# Patient Record
Sex: Female | Born: 1980 | Race: White | Hispanic: No | Marital: Married | Smoking: Current every day smoker
Health system: Southern US, Community
[De-identification: ages and names within clinical notes are randomized; demographics above are authoritative.]

## PROBLEM LIST (undated history)

## (undated) DIAGNOSIS — Z950 Presence of cardiac pacemaker: Secondary | ICD-10-CM

## (undated) DIAGNOSIS — Q246 Congenital heart block: Secondary | ICD-10-CM

## (undated) DIAGNOSIS — N809 Endometriosis, unspecified: Secondary | ICD-10-CM

## (undated) DIAGNOSIS — F411 Generalized anxiety disorder: Secondary | ICD-10-CM

## (undated) DIAGNOSIS — F339 Major depressive disorder, recurrent, unspecified: Secondary | ICD-10-CM

## (undated) DIAGNOSIS — I10 Essential (primary) hypertension: Secondary | ICD-10-CM

## (undated) HISTORY — PX: PACEMAKER INSERTION: SHX728

## (undated) HISTORY — DX: Endometriosis, unspecified: N80.9

## (undated) HISTORY — PX: ABDOMINAL HYSTERECTOMY: SHX81

## (undated) HISTORY — DX: Essential (primary) hypertension: I10

## (undated) HISTORY — DX: Generalized anxiety disorder: F41.1

## (undated) HISTORY — DX: Major depressive disorder, recurrent, unspecified: F33.9

---

## 2012-08-24 ENCOUNTER — Emergency Department (HOSPITAL_COMMUNITY)
Admission: EM | Admit: 2012-08-24 | Discharge: 2012-08-24 | Discharge status: Against Medical Advice | Payer: Medicaid Other | Attending: Internal Medicine | Admitting: Internal Medicine

## 2012-08-24 ENCOUNTER — Encounter (HOSPITAL_COMMUNITY): Payer: Self-pay | Admitting: *Deleted

## 2012-08-24 DIAGNOSIS — R51 Headache: Secondary | ICD-10-CM | POA: Insufficient documentation

## 2012-08-24 HISTORY — DX: Congenital heart block: Q24.6

## 2012-08-24 HISTORY — DX: Presence of cardiac pacemaker: Z95.0

## 2012-08-24 NOTE — ED Notes (Signed)
Pt to nurses station states no longer able to wait due to child care; instructed may return at any time; voiced understanding; discharged with family member

## 2012-08-24 NOTE — ED Notes (Signed)
Pt states was playing football two wks ago; c/o neck stiffness on left bottom of head causing her a headache; relieved by heat; states ice makes it worse

## 2014-07-09 ENCOUNTER — Emergency Department (INDEPENDENT_AMBULATORY_CARE_PROVIDER_SITE_OTHER)
Admission: EM | Admit: 2014-07-09 | Discharge: 2014-07-09 | Disposition: A | Discharge status: Home / Self Care | Payer: BLUE CROSS/BLUE SHIELD | Source: Home / Self Care | Attending: Family Medicine | Admitting: Family Medicine

## 2014-07-09 ENCOUNTER — Encounter (HOSPITAL_COMMUNITY): Payer: Self-pay | Admitting: *Deleted

## 2014-07-09 DIAGNOSIS — L0100 Impetigo, unspecified: Secondary | ICD-10-CM

## 2014-07-09 DIAGNOSIS — T148 Other injury of unspecified body region: Secondary | ICD-10-CM

## 2014-07-09 DIAGNOSIS — W5503XA Scratched by cat, initial encounter: Secondary | ICD-10-CM

## 2014-07-09 MED ORDER — AZITHROMYCIN 250 MG PO TABS: 250.0000 mg | ORAL_TABLET | Freq: Every day | ORAL | Status: DC

## 2014-07-09 MED ORDER — MUPIROCIN 2 % EX OINT: 1.0000 "application " | TOPICAL_OINTMENT | Freq: Two times a day (BID) | CUTANEOUS | Status: DC

## 2014-07-09 MED ORDER — FLUCONAZOLE 150 MG PO TABS: 150.0000 mg | ORAL_TABLET | Freq: Every day | ORAL | Status: DC

## 2014-07-09 NOTE — Discharge Instructions (Signed)
You have a infection of your scalp. This will require antibiotics in order to clear. Please take the antibiotics as directed. Please use the ointment twice a day for 3-5 days if the infection is not initially cleared with the antibiotic Please use the Diflucan if you develop a yeast infection.

## 2014-07-09 NOTE — ED Notes (Signed)
C/O "red, swollen, itchy bump" to left posterior scalp x 2 days.  Denies fevers.  States mildly tender.

## 2014-07-09 NOTE — ED Provider Notes (Signed)
CSN: [id number]     Arrival date & time 07/09/14  1609 History   First MD Initiated Contact with Patient 07/09/14 1645     Chief Complaint  Patient presents with  . Abscess   (Consider location/radiation/quality/duration/timing/severity/associated sxs/prior Treatment) HPI  Abscesss: started 2 days ago. On back of scalp. Getting bigger and ttp. No discharge. Deneis fevers, rash, nausea. Now w/ swollen bumps in neck. Has not tried anything to make it better. Sore is constant.    Past Medical History  Diagnosis Date  . Pacemaker   . Congenital heart block    Past Surgical History  Procedure Laterality Date  . Pacemaker insertion    . Abdominal hysterectomy     Family History  Problem Relation Age of Onset  . Hypertension Mother   . Hyperlipidemia Mother   . Hyperlipidemia Father   . Hypertension Father    History  Substance Use Topics  . Smoking status: Current Every Day Smoker -- 0.50 packs/day    Types: Cigarettes  . Smokeless tobacco: Not on file  . Alcohol Use: Yes     Comment: rarely   OB History    No data available     Review of Systems Per HPI with all other pertinent systems negative.    Allergies  Review of patient's allergies indicates no known allergies.  Home Medications   Prior to Admission medications   Medication Sig Start Date End Date Taking? Authorizing Provider  acetaminophen (TYLENOL) 500 MG tablet Take 1,000 mg by mouth every 6 (six) hours as needed for pain.    Historical Provider, MD  azithromycin (ZITHROMAX) 250 MG tablet Take 1 tablet (250 mg total) by mouth daily. Take first 2 tablets together, then 1 every day until finished. 07/09/14   Ozella Rocks, MD  fluconazole (DIFLUCAN) 150 MG tablet Take 1 tablet (150 mg total) by mouth daily. Repeat dose in 3 days 07/09/14   Ozella Rocks, MD  mupirocin ointment (BACTROBAN) 2 % Apply 1 application topically 2 (two) times daily. 07/09/14   Ozella Rocks, MD  PARoxetine (PAXIL) 20 MG tablet Take  20 mg by mouth daily.    Historical Provider, MD   BP 143/81 mmHg  Pulse 83  Temp(Src) 98 F (36.7 C) (Oral)  Resp 16  SpO2 100% Physical Exam  Constitutional: She is oriented to person, place, and time. She appears well-developed and well-nourished.  HENT:  Head: Normocephalic.  Eyes: EOM are normal. Pupils are equal, round, and reactive to light.  Neck: Normal range of motion.  Cardiovascular: Normal rate and normal heart sounds.   No murmur heard. Pulmonary/Chest: Effort normal and breath sounds normal.  Abdominal: Soft. She exhibits no distension.  Musculoskeletal: Normal range of motion. She exhibits no tenderness.  Lymphadenopathy:    She has cervical adenopathy.  Neurological: She is alert and oriented to person, place, and time.  Skin:  Left posterior scalp with single red scratch and multiple surrounding erythematous follicles with small pustules in the center. Few areas with additional honey crusting. Total area of induration approximately 2.5 x 2 cm grade no area of fluctuance. Tender to palpation.  Psychiatric: She has a normal mood and affect. Her behavior is normal. Judgment and thought content normal.    ED Course  Procedures (including critical care time) Labs Review Labs Reviewed - No data to display  Imaging Review No results found.   MDM   1. Cat scratch   2. Impetigo    Z-Pak Mupirocin  ointment NSAIDs for pain. Diflucan when necessary yeast infection. Precautions given and all questions answered   Shelly Flattenavid Cartier Mapel, MD Family Medicine 07/09/2014, 5:20 PM  .   Ozella Rocksavid J Zeanna Sunde, MD 07/09/14 [phone]

## 2018-07-15 ENCOUNTER — Encounter: Payer: Self-pay | Admitting: Family Medicine

## 2018-07-16 ENCOUNTER — Ambulatory Visit: Payer: Self-pay | Admitting: Family Medicine

## 2018-08-11 ENCOUNTER — Encounter: Payer: Self-pay | Admitting: Family Medicine

## 2018-08-13 ENCOUNTER — Other Ambulatory Visit: Payer: Self-pay

## 2018-08-13 ENCOUNTER — Ambulatory Visit (INDEPENDENT_AMBULATORY_CARE_PROVIDER_SITE_OTHER): Payer: 59 | Admitting: Family Medicine

## 2018-08-13 VITALS — BP 121/78 | HR 79 | Resp 17 | Ht 66.0 in | Wt 219.8 lb

## 2018-08-13 DIAGNOSIS — Z6835 Body mass index (BMI) 35.0-35.9, adult: Secondary | ICD-10-CM

## 2018-08-13 DIAGNOSIS — E669 Obesity, unspecified: Secondary | ICD-10-CM

## 2018-08-13 DIAGNOSIS — K649 Unspecified hemorrhoids: Secondary | ICD-10-CM

## 2018-08-13 DIAGNOSIS — K5901 Slow transit constipation: Secondary | ICD-10-CM

## 2018-08-13 DIAGNOSIS — Z1329 Encounter for screening for other suspected endocrine disorder: Secondary | ICD-10-CM

## 2018-08-13 MED ORDER — POLYETHYLENE GLYCOL 3350 17 G PO PACK: 17.0000 g | PACK | Freq: Every day | ORAL | 0 refills | Status: AC

## 2018-08-13 MED ORDER — HYDROCORTISONE ACETATE 25 MG RE SUPP: 25.0000 mg | Freq: Two times a day (BID) | RECTAL | 0 refills | Status: AC

## 2018-08-13 NOTE — Patient Instructions (Signed)
Thank you for choosing Primary Care at [facility] for your medical home!    Leslie Coffey was seen by Joaquin Courts, FNP today.   [name]'s primary care doctor is Bing Neighbors, FNP.   For the best care possible,  you should try to see Joaquin Courts, FNP-C  whenever you come to clinic.   We look forward to seeing you again soon!  If you have any questions about your visit today,  please call us at [phone]  Or feel free to reach your provider via MyChart.       Constipation, Adult Constipation is when a person:  Poops (has a bowel movement) fewer times in a week than normal.  Has a hard time pooping.  Has poop that is dry, hard, or bigger than normal. Follow these instructions at home: Eating and drinking   Eat foods that have a lot of fiber, such as: ? Fresh fruits and vegetables. ? Whole grains. ? Beans.  Eat less of foods that are high in fat, low in fiber, or overly processed, such as: ? [place] fries. ? Hamburgers. ? Cookies. ? Candy. ? Soda.  Drink enough fluid to keep your pee (urine) clear or pale yellow. General instructions  Exercise regularly or as told by your doctor.  Go to the restroom when you feel like you need to poop. Do not hold it in.  Take over-the-counter and prescription medicines only as told by your doctor. These include any fiber supplements.  Do pelvic floor retraining exercises, such as: ? Doing deep breathing while relaxing your lower belly (abdomen). ? Relaxing your pelvic floor while pooping.  Watch your condition for any changes.  Keep all follow-up visits as told by your doctor. This is important. Contact a doctor if:  You have pain that gets worse.  You have a fever.  You have not pooped for 4 days.  You throw up (vomit).  You are not hungry.  You lose weight.  You are bleeding from the anus.  You have thin, pencil-like poop (stool). Get help right away if:  You have a  fever, and your symptoms suddenly get worse.  You leak poop or have blood in your poop.  Your belly feels hard or bigger than normal (is bloated).  You have very bad belly pain.  You feel dizzy or you faint. This information is not intended to replace advice given to you by your health care provider. Make sure you discuss any questions you have with your health care provider. Document Released: 11/06/2007 Document Revised: 12/08/2015 Document Reviewed: 11/08/2015 Elsevier Interactive Patient Education  2019 [employer] Inc.   [employer] Content in Foods  See the following list for the dietary fiber content of some common foods. High-fiber foods High-fiber foods contain 4 grams or more (4g or more) of fiber per serving. They include:  Artichoke (fresh) - 1 medium has 10.3g of fiber.  Baked beans, plain or vegetarian (canned) -  cup has 5.2g of fiber.  Blackberries or raspberries (fresh) -  cup has 4g of fiber.  Bran cereal -  cup has 8.6g of fiber.  Bulgur (cooked) -  cup has 4g of fiber.  Kidney beans (canned) -  cup has 6.8g of fiber.  Lentils (cooked) -  cup has 7.8g of fiber.  Pear (fresh) - 1 medium has 5.1g of fiber.  Peas (frozen) -  cup has 4.4g of fiber.  Pinto beans (canned) -  cup has 5.5g of fiber.  Pinto beans (dried and cooked) -  cup has 7.7g of fiber.  Potato with skin (baked) - 1 medium has 4.4g of fiber.  Quinoa (cooked) -  cup has 5g of fiber.  Soybeans (canned, frozen, or fresh) -  cup has 5.1g of fiber. Moderate-fiber foods Moderate-fiber foods contain 1-4 grams (1-4g) of fiber per serving. They include:  Almonds - 1 oz. has 3.5g of fiber.  Apple with skin - 1 medium has 3.3g of fiber.  Applesauce, sweetened -  cup has 1.5g of fiber.  Bagel, plain - one 4-inch (10-cm) bagel has 2g of fiber.  Banana - 1 medium has 3.1g of fiber.  Broccoli (cooked) -  cup has 2.5g of fiber.  Carrots (cooked) -  cup has 2.3g of fiber.  Corn  (canned or frozen) -  cup has 2.1g of fiber.  Corn tortilla - one 6-inch (15-cm) tortilla has 1.5g of fiber.  Green beans (canned) -  cup has 2g of fiber.  Instant oatmeal -  cup has about 2g of fiber.  Long-grain brown rice (cooked) - 1 cup has 3.5g of fiber.  Macaroni, enriched (cooked) - 1 cup has 2.5g of fiber.  Melon - 1 cup has 1.4g of fiber.  Multigrain cereal -  cup has about 2-4g of fiber.  Orange - 1 small has 3.1g of fiber.  Potatoes, mashed -  cup has 1.6g of fiber.  Raisins - 1/4 cup has 1.6g of fiber.  Squash -  cup has 2.9g of fiber.  Sunflower seeds -  cup has 1.1g of fiber.  Tomato - 1 medium has 1.5g of fiber.  Vegetable or soy patty - 1 has 3.4g of fiber.  Whole-wheat bread - 1 slice has 2g of fiber.  Whole-wheat spaghetti -  cup has 3.2g of fiber. Low-fiber foods Low-fiber foods contain less than 1 gram (less than 1g) of fiber per serving. They include:  Egg - 1 large.  Flour tortilla - one 6-inch (15-cm) tortilla.  Fruit juice -  cup.  Lettuce - 1 cup.  Meat, poultry, or fish - 1 oz.  Milk - 1 cup.  Spinach (raw) - 1 cup.  White bread - 1 slice.  White rice -  cup.  Yogurt -  cup. Actual amounts of fiber in foods may be different depending on processing. Talk with your dietitian about how much fiber you need in your diet. This information is not intended to replace advice given to you by your health care provider. Make sure you discuss any questions you have with your health care provider. Document Released: 10/06/2006 Document Revised: 10/26/2015 Document Reviewed: 07/13/2015 Elsevier Interactive Patient Education  2019 [employer].

## 2018-08-13 NOTE — Progress Notes (Signed)
Leslie Coffey, is a 38 y.o. female  HKV:[id number]  FIE:[id number]  DOB - 1980-09-12  CC:  Chief Complaint  Patient presents with  . Establish Care  . Hemorrhoids  . Thyroid Problem       HPI: Leslie Coffey is a 38 y.o. female is here today to establish care, evaluation of hemorrhoids and thyroid evaluation. Constipation: Patient complains of constipation. Stool pattern has been irregular, skips days of bowel movements, and when stools occur, stools are very hard. This has remained a chronic issues. 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 Co-Morbid conditions:obesity, poor diet, and sedentary lifestyle. Currently experiencing pain with defecation is concern for hemorrhoids. Current Health Habits: Not eating fiber,  no exercise, no water intake? Endorses heavy caffeine intake daily.  Current OTC/RX therapy has been OTC with minimal relief. Denies tarry stools.  Referred for thyroid evaluation  Patient reports recent ophthalmology visit in which her eye doctor advised of need for thyroid screening. Denies symptoms of palpations or new fatigue. She endorses sensitivity to temperature. No prior history of thyroid problems.   Current medications: Current Outpatient Medications:  .  busPIRone (BUSPAR) 10 MG tablet, Take 1 tablet by mouth 2 (two) times daily as needed., Disp: , Rfl:  .  FLUoxetine (PROZAC) 40 MG capsule, Take 1 capsule by mouth daily., Disp: , Rfl:  .  fluticasone (FLONASE) 50 MCG/ACT nasal spray, OTC FLONASE AS NEEDED., Disp: , Rfl:  .  lisinopril (PRINIVIL,ZESTRIL) 10 MG tablet, Take 1 tablet by mouth daily., Disp: , Rfl:    Pertinent family medical history: family history includes Diabetes in her mother; Hyperlipidemia in her father and mother; Hypertension in her father and mother; Stroke in her father.   No Known Allergies  Social History   Socioeconomic History  . Marital status: Married    Spouse name: Not on file  . Number of  children: 3  . Years of education: Not on file  . Highest education level: Not on file  Occupational History  . Not on file  Social Needs  . Financial resource strain: Not on file  . Food insecurity:    Worry: Not on file    Inability: Not on file  . Transportation needs:    Medical: Not on file    Non-medical: Not on file  Tobacco Use  . Smoking status: Current Every Day Smoker    Packs/day: 0.50    Types: Cigarettes  . Smokeless tobacco: Never Used  Substance and Sexual Activity  . Alcohol use: Yes    Comment: rarely  . Drug use: No  . Sexual activity: Not on file  Lifestyle  . Physical activity:    Days per week: Not on file    Minutes per session: Not on file  . Stress: Not on file  Relationships  . Social connections:    Talks on phone: Not on file    Gets together: Not on file    Attends religious service: Not on file    Active member of club or organization: Not on file    Attends meetings of clubs or organizations: Not on file    Relationship status: Not on file  . Intimate partner violence:    Fear of current or ex partner: Not on file    Emotionally abused: Not on file    Physically abused: Not on file    Forced sexual activity: Not on file  Other Topics Concern  . Not on  file  Social History Narrative  . Not on file    Review of Systems: Pertinent negatives listed in HPI  Objective:   Vitals:   08/13/18 1428  BP: 121/78  Pulse: 79  Resp: 17  SpO2: 97%    BP Readings from Last 3 Encounters:  08/13/18 121/78  07/09/14 143/81  08/24/12 (!) 138/42    Filed Weights   08/13/18 1428  Weight: 219 lb 12.8 oz (99.7 kg)      Physical Exam: Constitutional: Patient appears well-developed and well-nourished. No distress. HENT: Normocephalic, atraumatic, External right and left ear normal. Oropharynx is clear and moist.  Eyes: Conjunctivae and EOM are normal. PERRLA, no scleral icterus. Neck: Normal ROM. Neck supple. No JVD. No tracheal  deviation. No thyromegaly. CVS: RRR, S1/S2 +, no murmurs, no gallops, no carotid bruit.  Pulmonary: Effort and breath sounds normal, no stridor, rhonchi, wheezes, rales.  Rectal exam: External hernia visible, no fissures   Abdominal: Soft. BS +, no distension, tenderness, rebound or guarding.  Musculoskeletal: Normal range of motion. No edema and no tenderness.  Neuro: Alert. Normal muscle tone coordination. Normal gait. BUE and BLE strength 5/5.  Skin: Skin is warm and dry. No rash noted. Not diaphoretic. No erythema. No pallor. Psychiatric: Normal mood and affect. Behavior, judgment, thought content normal.    Assessment and plan:  1. Screening for thyroid disorder - Thyroid Panel With TSH  2. Slow transit constipation Increase water intake to 6-8 , 8 ounce glasses of water  Implement fiber into diet. Do not hold bowel movement Continue Miralax daily Start daily stool softener   3. BMI 35.0-35.9,adult Encouraged efforts to reduce weight include engaging in physical activity as tolerated with goal of 150 minutes per week. Improve dietary choices and eat a meal regimen consistent with a Mediterranean or DASH diet. Reduce simple carbohydrates. Do not skip meals and eat healthy snacks throughout the day to avoid over-eating at dinner. Set a goal weight loss that is achievable for you.  4. Hemorrhoids, unspecified hemorrhoid type -Anusol suppositories twice daily as needed   Return for Complete Physical Exam.   The patient was given clear instructions to go to ER or return to medical center if symptoms don't improve, worsen or new problems develop. The patient verbalized understanding. The patient was advised  to call and obtain lab results if they haven't heard anything from out office within 7-10 business days.  Joaquin Courts, FNP [facility] at [facility] [address] [place] [address] [phone]: [phone]    This note has been created with  Dragon speech recognition software and Paediatric nurse. Any transcriptional errors are unintentional.

## 2018-08-14 LAB — THYROID PANEL WITH TSH
FREE THYROXINE INDEX: 1.4 (ref 1.2–4.9)
T3 Uptake Ratio: 21 % — ABNORMAL LOW (ref 24–39)
T4 TOTAL: 6.8 ug/dL (ref 4.5–12.0)
TSH: 1.46 u[IU]/mL (ref 0.450–4.500)

## 2018-08-16 DIAGNOSIS — K649 Unspecified hemorrhoids: Secondary | ICD-10-CM | POA: Insufficient documentation

## 2018-08-16 DIAGNOSIS — K5901 Slow transit constipation: Secondary | ICD-10-CM | POA: Insufficient documentation

## 2018-09-16 ENCOUNTER — Ambulatory Visit: Payer: 59 | Admitting: Family Medicine

## 2019-05-15 ENCOUNTER — Emergency Department (HOSPITAL_COMMUNITY): Payer: 59

## 2019-05-15 ENCOUNTER — Other Ambulatory Visit: Payer: Self-pay

## 2019-05-15 ENCOUNTER — Emergency Department (HOSPITAL_COMMUNITY)
Admission: EM | Admit: 2019-05-15 | Discharge: 2019-05-15 | Discharge status: Against Medical Advice | Payer: 59 | Attending: Emergency Medicine | Admitting: Emergency Medicine

## 2019-05-15 ENCOUNTER — Encounter (HOSPITAL_COMMUNITY): Payer: Self-pay | Admitting: Emergency Medicine

## 2019-05-15 DIAGNOSIS — Z950 Presence of cardiac pacemaker: Secondary | ICD-10-CM | POA: Insufficient documentation

## 2019-05-15 DIAGNOSIS — Z79899 Other long term (current) drug therapy: Secondary | ICD-10-CM | POA: Diagnosis not present

## 2019-05-15 DIAGNOSIS — Z5320 Procedure and treatment not carried out because of patient's decision for unspecified reasons: Secondary | ICD-10-CM | POA: Insufficient documentation

## 2019-05-15 DIAGNOSIS — K029 Dental caries, unspecified: Secondary | ICD-10-CM | POA: Insufficient documentation

## 2019-05-15 DIAGNOSIS — F17210 Nicotine dependence, cigarettes, uncomplicated: Secondary | ICD-10-CM | POA: Insufficient documentation

## 2019-05-15 DIAGNOSIS — R002 Palpitations: Secondary | ICD-10-CM | POA: Diagnosis not present

## 2019-05-15 DIAGNOSIS — K0889 Other specified disorders of teeth and supporting structures: Secondary | ICD-10-CM | POA: Insufficient documentation

## 2019-05-15 DIAGNOSIS — R350 Frequency of micturition: Secondary | ICD-10-CM | POA: Insufficient documentation

## 2019-05-15 LAB — CBC WITH DIFFERENTIAL/PLATELET
Abs Immature Granulocytes: 0.04 10*3/uL (ref 0.00–0.07)
Basophils Absolute: 0.1 10*3/uL (ref 0.0–0.1)
Basophils Relative: 1 %
Eosinophils Absolute: 0.3 10*3/uL (ref 0.0–0.5)
Eosinophils Relative: 3 %
HCT: 42.2 % (ref 36.0–46.0)
Hemoglobin: 14.1 g/dL (ref 12.0–15.0)
Immature Granulocytes: 0 %
Lymphocytes Relative: 33 %
Lymphs Abs: 3.4 10*3/uL (ref 0.7–4.0)
MCH: 31.3 pg (ref 26.0–34.0)
MCHC: 33.4 g/dL (ref 30.0–36.0)
MCV: 93.6 fL (ref 80.0–100.0)
Monocytes Absolute: 0.7 10*3/uL (ref 0.1–1.0)
Monocytes Relative: 7 %
Neutro Abs: 5.7 10*3/uL (ref 1.7–7.7)
Neutrophils Relative %: 56 %
Platelets: 275 10*3/uL (ref 150–400)
RBC: 4.51 MIL/uL (ref 3.87–5.11)
RDW: 13.2 % (ref 11.5–15.5)
WBC: 10.2 10*3/uL (ref 4.0–10.5)
nRBC: 0 % (ref 0.0–0.2)

## 2019-05-15 LAB — URINALYSIS, ROUTINE W REFLEX MICROSCOPIC
Bilirubin Urine: NEGATIVE
Glucose, UA: NEGATIVE mg/dL
Ketones, ur: NEGATIVE mg/dL
Nitrite: NEGATIVE
Protein, ur: NEGATIVE mg/dL
Specific Gravity, Urine: 1.016 (ref 1.005–1.030)
pH: 7 (ref 5.0–8.0)

## 2019-05-15 MED ORDER — AMOXICILLIN-POT CLAVULANATE 875-125 MG PO TABS: 1.0000 | ORAL_TABLET | Freq: Two times a day (BID) | ORAL | 0 refills | Status: AC

## 2019-05-15 MED ORDER — PENICILLIN V POTASSIUM 500 MG PO TABS: 500.0000 mg | ORAL_TABLET | Freq: Four times a day (QID) | ORAL | 0 refills | Status: DC

## 2019-05-15 MED ORDER — IBUPROFEN 400 MG PO TABS
600.0000 mg | ORAL_TABLET | Freq: Once | ORAL | Status: AC
  Administered 2019-05-15: 600 mg via ORAL
  Filled 2019-05-15: qty 1

## 2019-05-15 NOTE — Discharge Instructions (Addendum)
You have decided to sign out [place] prior to all of your bloodwork resulting.   Please keep appointment as scheduled with your PCP on Wednesday. Follow up with him regarding your symptoms that  brought you to the ED today.   I have prescribed antibiotics that will cover for both a dental infection as well as a UTI. We have sent your urine for culture. You will receive a call in 2-3 days IF this antibiotic needs to be changed based on culture results.   Return to the ED IMMEDIATELY for any worsening symptoms including worsening palpitations, shortness of breath, chest pain, vomiting, excessive sweating, vision changes, weakness or numbness on one side of your body.

## 2019-05-15 NOTE — ED Provider Notes (Signed)
[facility] EMERGENCY DEPARTMENT Provider Note   CSN: [id number] Arrival date & time: 05/15/19  1241     History Chief Complaint  Patient presents with   Palpitations    Leslie Coffey is a 38 y.o. female with PMHx congenital heart block with pacemaker, HTN, MDD, and GAD who presents to the ED today with multiple complaints.   Pt complaining of intermittent heart palpitations for the past 4 to 5 days.  She does report that this happens several times per day and lasts a couple of seconds before resolving.  Patient does have a history of anxiety and states that she feels like it could be a panic attack.  She does endorse increased stress at work recently as she works at [employer] states that with the holiday season it has been very stressful.  They are also preparing to initiate Covid vaccines and states that they have had to increase security recently which has her stressed. Denies chest pain, shortness of breath. No recent prolonged travel or immobilization. No hx of DVT/PE. No hemoptysis. No exogenous hormone use. No active malignancy.   She also endorses that she has felt some tightness in her left upper arm for the past several days.  She does believe that this happens with the palpitations but is not quite sure.  She does state that she is left-handed and recently moved and has been carrying heavy boxes and is unsure if this is related to it.  She is denying any weakness, numbness, tingling.   Also reports gradual onset, constant, worsening, right lower dental pain for the past month.  She does endorse that she has been taking ibuprofen and Tylenol without relief.  She is currently in the process of trying to get in with a dentist but does not have one currently.  Patient states that this pain also causes pain in her head and causes it to throb.  Patient denies fever, chills, drainage from tooth or foul taste in mouth, trismus, drooling, changes, nausea, vomiting,  unilateral weakness or numbness,rash, neck stiffness, any other associated symptoms.      Past Medical History:  Diagnosis Date   Congenital heart block    Endometriosis    Essential hypertension    GAD (generalized anxiety disorder)    Pacemaker    Recurrent major depressive disorder [facility])     Patient Active Problem List   Diagnosis Date Noted   Slow transit constipation 08/16/2018   Hemorrhoids 08/16/2018    Past Surgical History:  Procedure Laterality Date   ABDOMINAL HYSTERECTOMY     CESAREAN SECTION     PACEMAKER INSERTION       OB History   No obstetric history on file.     Family History  Problem Relation Age of Onset   Hypertension Mother    Hyperlipidemia Mother    Diabetes Mother    Hyperlipidemia Father    Hypertension Father    Stroke Father     Social History   Tobacco Use   Smoking status: Current Every Day Smoker    Packs/day: 0.50    Types: Cigarettes   Smokeless tobacco: Never Used  Substance Use Topics   Alcohol use: Yes    Comment: rarely   Drug use: No    Home Medications Prior to Admission medications   Medication Sig Start Date End Date Taking? Authorizing Provider  amoxicillin-clavulanate (AUGMENTIN) 875-125 MG tablet Take 1 tablet by mouth every 12 (twelve) hours. 05/15/19   Tanda Rockers,  PA-C  busPIRone (BUSPAR) 10 MG tablet Take 1 tablet by mouth 2 (two) times daily as needed. 11/21/17   [provider]  FLUoxetine (PROZAC) 40 MG capsule Take 1 capsule by mouth daily. 01/28/18   [provider]  fluticasone (FLONASE) 50 MCG/ACT nasal spray OTC FLONASE AS NEEDED. 03/30/18   [provider]  hydrocortisone (ANUSOL-HC) 25 MG suppository Place 1 suppository (25 mg total) rectally 2 (two) times daily. 08/13/18   Scot Jun, FNP  lisinopril (PRINIVIL,ZESTRIL) 10 MG tablet Take 1 tablet by mouth daily. 01/20/18   [provider]  polyethylene glycol (MIRALAX / GLYCOLAX)  packet Take 17 g by mouth daily. 08/13/18   Scot Jun, FNP    Allergies    Patient has no known allergies.  Review of Systems   Review of Systems  Constitutional: Negative for chills and fever.  HENT: Positive for dental problem. Negative for drooling, ear discharge, ear pain and sore throat.   Eyes: Negative for visual disturbance.  Respiratory: Negative for cough and shortness of breath.   Cardiovascular: Positive for palpitations. Negative for chest pain and leg swelling.  Gastrointestinal: Negative for nausea and vomiting.  Genitourinary: Negative for difficulty urinating.  Musculoskeletal: Negative for neck pain and neck stiffness.  Skin: Negative for rash.  Neurological: Positive for headaches.    Physical Exam Updated Vital Signs BP 139/68 (BP Location: Right Arm)    Pulse 65    Temp 97.8 F (36.6 C) (Oral)    Resp 16    SpO2 99%   Physical Exam Vitals and nursing note reviewed.  Constitutional:      Appearance: She is obese. She is not ill-appearing or diaphoretic.  HENT:     Head: Normocephalic and atraumatic.     Nose:     Right Sinus: Maxillary sinus tenderness present. No frontal sinus tenderness.     Left Sinus: Frontal sinus tenderness present. No maxillary sinus tenderness.     Mouth/Throat:     Comments: Nose clear.  R lower tooth #32 decayed with caries with  TTP, with minimal surrounding gingival swelling and erythema, no definite abscess, no evidence of ludwig's.  Oropharynx clear and moist, without uvular swelling or deviation, no trismus or drooling, no tonsillar swelling or erythema, no exudates.    Eyes:     Extraocular Movements: Extraocular movements intact.     Conjunctiva/sclera: Conjunctivae normal.     Pupils: Pupils are equal, round, and reactive to light.  Cardiovascular:     Rate and Rhythm: Normal rate and regular rhythm.     Pulses: Normal pulses.  Pulmonary:     Effort: Pulmonary effort is normal.     Breath sounds: Normal  breath sounds. No wheezing, rhonchi or rales.  Chest:     Chest wall: No tenderness.  Abdominal:     Palpations: Abdomen is soft.     Tenderness: There is no abdominal tenderness. There is no guarding or rebound.  Musculoskeletal:     Cervical back: Neck supple.     Right lower leg: No edema.     Left lower leg: No edema.     Comments: MAEs x 4. Strength 5/5 to BUE and BLEs. Sensation intact throughout. 2+ radial and DP pulses.   Skin:    General: Skin is warm and dry.  Neurological:     Mental Status: She is alert.     Comments: CN 3-12 grossly intact A&O x4 GCS 15 Sensation and strength intact Gait nonataxic  including with tandem walking Coordination with finger-to-nose WNL Neg romberg, neg pronator drift     ED Results / Procedures / Treatments   Labs (all labs ordered are listed, but only abnormal results are displayed) Labs Reviewed  URINALYSIS, ROUTINE W REFLEX MICROSCOPIC - Abnormal; Notable for the following components:      Result Value   APPearance HAZY (*)    Hgb urine dipstick SMALL (*)    Leukocytes,Ua LARGE (*)    Bacteria, UA RARE (*)    All other components within normal limits  URINE CULTURE  CBC WITH DIFFERENTIAL/PLATELET  BASIC METABOLIC PANEL  TROPONIN I (HIGH SENSITIVITY)  TROPONIN I (HIGH SENSITIVITY)    EKG EKG Interpretation  Date/Time:  Saturday May 15 2019 12:52:12 EST Ventricular Rate:  65 PR Interval:    QRS Duration: 174 QT Interval:  488 QTC Calculation: 507 R Axis:   -86 Text Interpretation: Sinus rhythm with complete heart block and Ventricular-paced rhythm Abnormal ECG No old tracing to compare Confirmed by Jacalyn LefevreHaviland, Julie [phone]([id number]) on 05/15/2019 1:55:04 PM   Radiology DG Chest Port 1 View  Result Date: 05/15/2019 CLINICAL DATA:  38 year old female with palpitations EXAM: PORTABLE CHEST 1 VIEW COMPARISON:  None. FINDINGS: Cardiac diameter borderline enlarged. Cardiac pacing device on the right chest wall with 2 leads in  place. No pneumothorax or pleural effusion.  No confluent airspace disease. IMPRESSION: Negative for acute cardiopulmonary disease. Borderline cardiomegaly with right chest wall cardiac pacing device. Electronically Signed   By: Gilmer MorJaime  Wagner D.O.   On: 05/15/2019 15:03    Procedures Procedures (including critical care time)  Medications Ordered in ED Medications  ibuprofen (ADVIL) tablet 600 mg (600 mg Oral Given 05/15/19 1521)    ED Course  I have reviewed the triage vital signs and the nursing notes.  Pertinent labs & imaging results that were available during my care of the patient were reviewed by me and considered in my medical decision making (see chart for details).  38 year old female who presents to the ED with multiple complaints including palpitations, left arm tightness, right lower dental pain.  Does have a pacemaker in place from a congenital heart block.  Will have this interrogated.  She is denying any chest pain or shortness of breath with the palpitations.  She does have a history of anxiety and depression states this feels more like an anxiety attack as she has been increased with stress at work.  Is in the process of getting established with a cardiologist here in the area.  She has a PCP appointment scheduled for Wednesday to establish care and is currently in the process of getting in with a dentist.  Ports that her family made her come to the ED today to be evaluated.  On arrival to the ED patient is afebrile.  Her heart rate is in the 60s consistently.  She is nontachypneic.  She is satting 99% on room air and normotensive blood pressure.  Does not appear to be in any acute distress.  She is PERC negative.  Do not feel she needs D-dimer at this time.  Plan screening labs at this time.  Plan to discharge patient with antibiotic for her tooth and dental resources.  No signs of trismus or drooling.  No definitive dental abscess to be drained today.  Airway intact.   EKG with  paced rhythm. CBC without leukocytosis. Hgb with large leuks and 6-10 RBCs per HPF. Rare bacteria. No WBCs. Pt does endorse she has been urinating  more frequently than normal as of late.  Denying any dysuria, vaginal discharge, pelvic pain.  No abdominal, nausea, vomiting.  Had initially prescribed penicillin to cover for dental infection but will change to Augmentin per pharmacy recommendations.  Urine culture ordered.   Unfortunately patient's BMP and troponin hemolyzed.  Had initially reordered this and updated patient on plan.  She reports that her anxiety is getting the better of her during her ED visit today and she wishes to sign out AGAINST MEDICAL ADVICE.  Lengthy discussion with patient regarding risks versus benefits.  She does have a PCP appointment this week and states that she will mention her symptoms to him.  Strict return precautions have been discussed with patient including any new or worsening symptoms.  Patient is in agreement that she will return to the ED if her symptoms worsen in any way.  Discharged with antibiotics to cover for UTI and dental infection.  Resource for this in the area given.   This note was prepared using Dragon voice recognition software and may include unintentional dictation errors due to the inherent limitations of voice recognition software.    MDM Rules/Calculators/A&P PERC negative.   Final Clinical Impression(s) / ED Diagnoses Final diagnoses:  Palpitations  Pain, dental  Urinary frequency    Rx / DC Orders ED Discharge Orders         Ordered    penicillin v potassium (VEETID) 500 MG tablet  4 times daily,   Status:  Discontinued     05/15/19 1457    amoxicillin-clavulanate (AUGMENTIN) 875-125 MG tablet  Every 12 hours     05/15/19 1611           Discharge Instructions     You have decided to sign out AGAINST MEDICAL ADVICE prior to all of your bloodwork resulting.   Please keep appointment as scheduled with your PCP on Wednesday.  Follow up with him regarding your symptoms that  brought you to the ED today.   I have prescribed antibiotics that will cover for both a dental infection as well as a UTI. We have sent your urine for culture. You will receive a call in 2-3 days IF this antibiotic needs to be changed based on culture results.   Return to the ED IMMEDIATELY for any worsening symptoms including worsening palpitations, shortness of breath, chest pain, vomiting, excessive sweating, vision changes, weakness or numbness on one side of your body.        Tanda Rockers, PA-C 05/15/19 1615    Jacalyn Lefevre, MD 05/16/19 [phone]

## 2019-05-15 NOTE — ED Notes (Signed)
medtronic pacemaker interrogated  

## 2019-05-15 NOTE — ED Notes (Signed)
ED Provider at bedside. 

## 2019-05-15 NOTE — ED Triage Notes (Signed)
Pt from [facility] in [place] for palpitations x 4-5 days that has increased in frequency over the last 2 days.  Pt has a pacemaker.  C/o L arm heaviness earlier today.  Denies pain and palpitations at present.  20G L AC.

## 2019-05-16 ENCOUNTER — Telehealth: Payer: Self-pay | Admitting: *Deleted

## 2019-05-16 LAB — URINE CULTURE: Culture: NO GROWTH

## 2019-05-16 NOTE — Telephone Encounter (Signed)
ED CM received a call from [place] [facility] [place]. CM requested to call her back and she agrees.   CM called Deri back. She asked if the patient is supposed to be on Penicillin or Augmentin. She states a prescription was received for both. CM reviewed the physician's discharge note and informed the pharmacist PCN was discontinued.

## 2021-07-12 IMAGING — DX DG CHEST 1V PORT
1 series · 1 of 1 positions shown · non-contrast
Comparison: None.

CLINICAL DATA: 38-year-old female with palpitations

EXAM:
PORTABLE CHEST 1 VIEW

[chest ap]
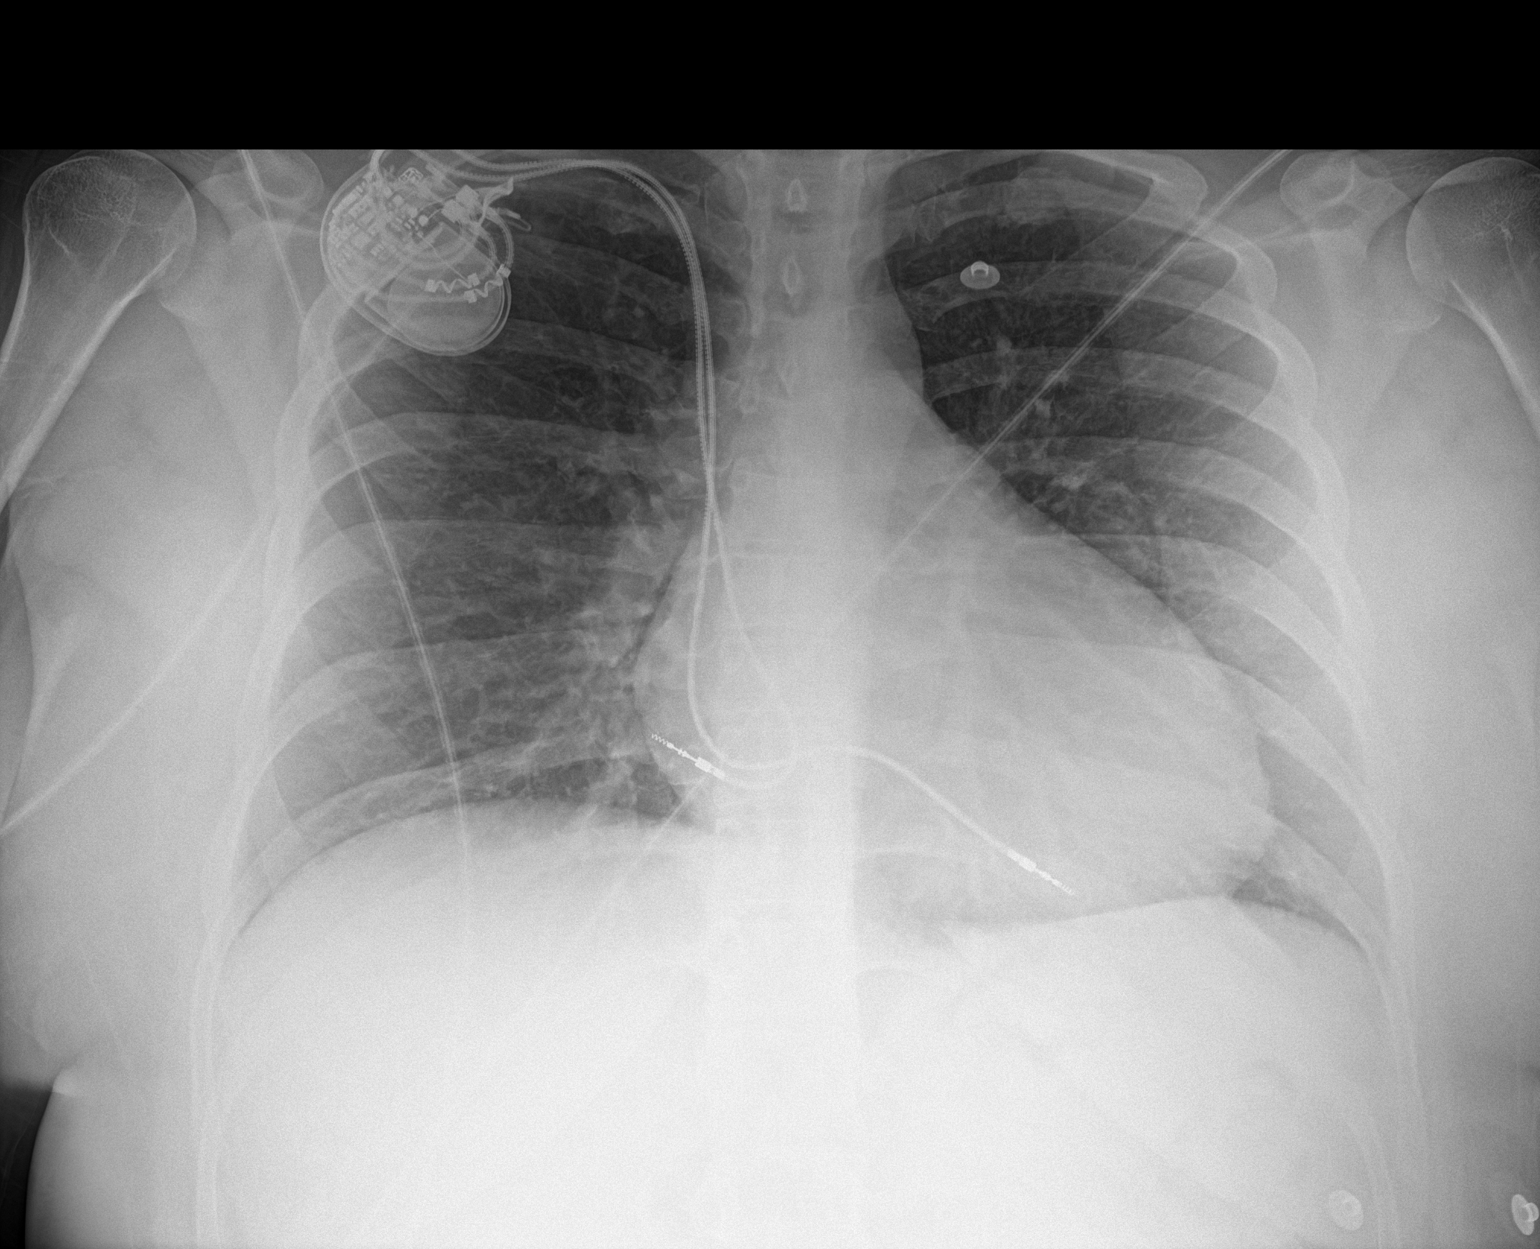

[1 of 1 positions shown; findings below may reference images not displayed]

FINDINGS: Cardiac diameter borderline enlarged. Cardiac pacing device on the
right chest wall with 2 leads in place.

No pneumothorax or pleural effusion.  No confluent airspace disease.
IMPRESSION: Negative for acute cardiopulmonary disease.

Borderline cardiomegaly with right chest wall cardiac pacing device.
